# Patient Record
Sex: Male | Born: 1970 | Hispanic: Yes | Smoking: Never smoker
Health system: Southern US, Community
[De-identification: ages and names within clinical notes are randomized; demographics above are authoritative.]

## PROBLEM LIST (undated history)

## (undated) DIAGNOSIS — I10 Essential (primary) hypertension: Secondary | ICD-10-CM

## (undated) HISTORY — DX: Essential (primary) hypertension: I10

---

## 2011-03-23 HISTORY — PX: OTHER SURGICAL HISTORY: SHX169

## 2018-02-01 ENCOUNTER — Encounter: Payer: Self-pay | Admitting: Internal Medicine

## 2018-02-01 ENCOUNTER — Ambulatory Visit: Payer: BC Managed Care – PPO | Admitting: Internal Medicine

## 2018-02-01 VITALS — BP 138/76 | HR 75 | Ht 70.0 in | Wt 214.0 lb

## 2018-02-01 DIAGNOSIS — N63 Unspecified lump in unspecified breast: Secondary | ICD-10-CM | POA: Diagnosis not present

## 2018-02-01 NOTE — Progress Notes (Signed)
    Date:  02/01/2018   Name:  George GinsbergFrancisco Alvarado   DOB:  15-Feb-1971   MRN:  [id number]   Chief Complaint: Establish Care (New Patient. ) and Breast Mass (L Breast mass since 2016. )  Breast mass - on left noted in 2015 and repeat in 2016 showed no change in a fatty mass.  [facility]  He was reassured that it was only fatty and did not need to be removed.  He says that he has discomfort from time to time.  No change in size of mass.  He has intermittent discomfort on the right side as well.  No nipple discharge, skin changes.   Review of Systems  Constitutional: Negative for chills, fatigue and fever.  Respiratory: Negative for cough, chest tightness and wheezing.   Cardiovascular: Negative for chest pain, palpitations and leg swelling.  Gastrointestinal: Negative for abdominal pain.  Neurological: Negative for dizziness and headaches.  Hematological: Negative for adenopathy. Does not bruise/bleed easily.  Psychiatric/Behavioral: Negative for dysphoric mood and sleep disturbance.    Patient Active Problem List   Diagnosis Date Noted  . Breast mass in male 02/01/2018    No Known Allergies  Past Surgical History:  Procedure Laterality Date  . herniated disc repair  2013    Social History   Tobacco Use  . Smoking status: Never Smoker  . Smokeless tobacco: Never Used  Substance Use Topics  . Alcohol use: Never    Frequency: Never    Comment: rare  . Drug use: Never     Medication list has been reviewed and updated.  No outpatient medications have been marked as taking for the 02/01/18 encounter (Office Visit) with Reubin MilanBerglund, Charnelle Bergeman H, MD.    [facility]2018  PHQ - 2 Score 1    Physical Exam  Constitutional: He is oriented to person, place, and time. He appears well-developed. No distress.  HENT:  Head: Normocephalic and atraumatic.  Neck: Normal range of motion. Neck supple. Carotid bruit is not present.  Cardiovascular: Normal rate, regular  rhythm and normal heart sounds.  Pulmonary/Chest: Effort normal and breath sounds normal. No respiratory distress. He has no wheezes. He has no rhonchi. Right breast exhibits no mass, no nipple discharge and no skin change. Left breast exhibits no mass, no nipple discharge and no skin change.    Abdominal: Soft. Normal appearance.  Musculoskeletal: Normal range of motion.  Neurological: He is alert and oriented to person, place, and time.  Skin: Skin is warm and dry. No rash noted.  Psychiatric: He has a normal mood and affect. His behavior is normal. Thought content normal.  Nursing note and vitals reviewed.   BP 138/76 (BP Location: Right Arm, Patient Position: Sitting, Cuff Size: Large)   Pulse 75   Ht 5\' 10"  (1.778 m)   Wt 214 lb (97.1 kg)   SpO2 100%   BMI 30.71 kg/m   Assessment and Plan: 1. Breast mass in male Will get follow up mammogram and US Refer to General surgery - MM DIAG BREAST TOMO BILATERAL; Future - US BREAST LTD UNI LEFT INC AXILLA; Future - US BREAST LTD UNI RIGHT INC AXILLA; Future   Partially dictated using Animal nutritionistDragon software. Any errors are unintentional.  Bari EdwardLaura Abra Lingenfelter, MD [facility]  02/01/2018

## 2018-02-10 ENCOUNTER — Ambulatory Visit
Admission: RE | Admit: 2018-02-10 | Discharge: 2018-02-10 | Disposition: A | Discharge status: Home / Self Care | Payer: BC Managed Care – PPO | Source: Ambulatory Visit | Attending: Internal Medicine | Admitting: Internal Medicine

## 2018-02-10 DIAGNOSIS — N63 Unspecified lump in unspecified breast: Secondary | ICD-10-CM

## 2018-02-10 NOTE — Progress Notes (Signed)
Spoke with patient and informed of mammo.

## 2018-12-29 ENCOUNTER — Encounter: Payer: Self-pay | Admitting: Internal Medicine

## 2018-12-29 ENCOUNTER — Ambulatory Visit: Payer: BC Managed Care – PPO | Admitting: Internal Medicine

## 2018-12-29 ENCOUNTER — Other Ambulatory Visit: Payer: Self-pay

## 2018-12-29 VITALS — BP 136/84 | HR 68 | Resp 16 | Ht 70.0 in | Wt 200.0 lb

## 2018-12-29 DIAGNOSIS — R9431 Abnormal electrocardiogram [ECG] [EKG]: Secondary | ICD-10-CM

## 2018-12-29 DIAGNOSIS — M7712 Lateral epicondylitis, left elbow: Secondary | ICD-10-CM

## 2018-12-29 MED ORDER — MELOXICAM 7.5 MG PO TABS: 7.5000 mg | ORAL_TABLET | Freq: Every day | ORAL | 0 refills | Status: DC

## 2018-12-29 NOTE — Patient Instructions (Addendum)
Your blood pressure goal is 130/80 or less.  Check at home several times a week and write it down.  Tennis Elbow Tennis elbow is swelling (inflammation) in your outer forearm, near your elbow. Swelling affects the tissues that connect muscle to bone (tendons). Tennis elbow can happen in any sport or job in which you use your elbow too much. It is caused by doing the same motion over and over. Tennis elbow can cause:  Pain and tenderness in your forearm and the outer part of your elbow. You may have pain all the time, or only when using the arm.  A burning feeling. This runs from your elbow through your arm.  Weak grip in your hand. Follow these instructions at home: Activity  Rest your elbow and wrist. Avoid activities that cause problems, as told by your doctor.  If told by your doctor, wear an elbow strap to reduce stress on the area.  Do physical therapy exercises as told.  If you lift an object, lift it with your palm facing up. This is easier on your elbow. Lifestyle  If your tennis elbow is caused by sports, check your equipment and make sure that: ? You are using it correctly. ? It fits you well.  If your tennis elbow is caused by work or by using a computer, take breaks often to stretch your arm. Talk with your manager about how you can manage your condition at work. If you have a brace:  Wear the brace as told by your doctor. Remove it only as told by your doctor.  Loosen the brace if your fingers tingle, get numb, or turn cold and blue.  Keep the brace clean.  If the brace is not waterproof, ask your doctor if you may take the brace off for bathing. If you must keep the brace on while bathing: ? Do not let it get wet. ? Cover it with a watertight covering when you take a bath or a shower. General instructions   If told, put ice on the painful area: ? Put ice in a plastic bag. ? Place a towel between your skin and the bag. ? Leave the ice on for 20 minutes, 2-3  times a day.  Take over-the-counter and prescription medicines only as told by your doctor.  Keep all follow-up visits as told by your doctor. This is important. Contact a doctor if:  Your pain does not get better with treatment.  Your pain gets worse.  You have weakness in your forearm, hand, or fingers.  You cannot feel your forearm, hand, or fingers. Summary  Tennis elbow is swelling (inflammation) in your outer forearm, near your elbow.  Tennis elbow is caused by doing the same motion over and over.  Rest your elbow and wrist. Avoid activities that cause problems, as told by your doctor.  If told, put ice on the painful area for 20 minutes, 2-3 times a day. This information is not intended to replace advice given to you by your health care provider. Make sure you discuss any questions you have with your health care provider. Document Released: 08/26/2009 Document Revised: 12/02/2017 Document Reviewed: 12/21/2016 Elsevier Patient Education  2020 [place] DASH stands for "Dietary Approaches to Stop Hypertension." The DASH eating plan is a healthy eating plan that has been shown to reduce high blood pressure (hypertension). It may also reduce your risk for type 2 diabetes, heart disease, and stroke. The DASH eating plan may also help with  weight loss. What are tips for following this plan?  General guidelines  Avoid eating more than 2,300 mg (milligrams) of salt (sodium) a day. If you have hypertension, you may need to reduce your sodium intake to 1,500 mg a day.  Limit alcohol intake to no more than 1 drink a day for nonpregnant women and 2 drinks a day for men. One drink equals 12 oz of beer, 5 oz of wine, or 1 oz of hard liquor.  Work with your health care provider to maintain a healthy body weight or to lose weight. Ask what an ideal weight is for you.  Get at least 30 minutes of exercise that causes your heart to beat faster (aerobic exercise) most  days of the week. Activities may include walking, swimming, or biking.  Work with your health care provider or diet and nutrition specialist (dietitian) to adjust your eating plan to your individual calorie needs. Reading food labels   Check food labels for the amount of sodium per serving. Choose foods with less than 5 percent of the Daily Value of sodium. Generally, foods with less than 300 mg of sodium per serving fit into this eating plan.  To find whole grains, look for the word "whole" as the first word in the ingredient list. Shopping  Buy products labeled as "low-sodium" or "no salt added."  Buy fresh foods. Avoid canned foods and premade or frozen meals. Cooking  Avoid adding salt when cooking. Use salt-free seasonings or herbs instead of table salt or sea salt. Check with your health care provider or pharmacist before using salt substitutes.  Do not fry foods. Cook foods using healthy methods such as baking, boiling, grilling, and broiling instead.  Cook with heart-healthy oils, such as olive, canola, soybean, or sunflower oil. Meal planning  Eat a balanced diet that includes: ? 5 or more servings of fruits and vegetables each day. At each meal, try to fill half of your plate with fruits and vegetables. ? Up to 6-8 servings of whole grains each day. ? Less than 6 oz of lean meat, poultry, or fish each day. A 3-oz serving of meat is about the same size as a deck of cards. One egg equals 1 oz. ? 2 servings of low-fat dairy each day. ? A serving of nuts, seeds, or beans 5 times each week. ? Heart-healthy fats. Healthy fats called Omega-3 fatty acids are found in foods such as flaxseeds and coldwater fish, like sardines, salmon, and mackerel.  Limit how much you eat of the following: ? Canned or prepackaged foods. ? Food that is high in trans fat, such as fried foods. ? Food that is high in saturated fat, such as fatty meat. ? Sweets, desserts, sugary drinks, and other foods  with added sugar. ? Full-fat dairy products.  Do not salt foods before eating.  Try to eat at least 2 vegetarian meals each week.  Eat more home-cooked food and less restaurant, buffet, and fast food.  When eating at a restaurant, ask that your food be prepared with less salt or no salt, if possible. What foods are recommended? The items listed may not be a complete list. Talk with your dietitian about what dietary choices are best for you. Grains Whole-grain or whole-wheat bread. Whole-grain or whole-wheat pasta. Brown rice. Or[place] Cobbatmeal. Quinoa. Bulgur. Whole-grain and low-sodium cereals. Pita bread. Low-fat, low-sodium crackers. Whole-wheat flour tortillas. Vegetables Fresh or frozen vegetables (raw, steamed, roasted, or grilled). Low-sodium or reduced-sodium tomato and vegetable juice. Low-sodium  or reduced-sodium tomato sauce and tomato paste. Low-sodium or reduced-sodium canned vegetables. Fruits All fresh, dried, or frozen fruit. Canned fruit in natural juice (without added sugar). Meat and other protein foods Skinless chicken or [place]. Ground chicken or [place]. Pork with fat trimmed off. Fish and seafood. Egg whites. Dried beans, peas, or lentils. Unsalted nuts, nut butters, and seeds. Unsalted canned beans. Lean cuts of beef with fat trimmed off. Low-sodium, lean deli meat. Dairy Low-fat (1%) or fat-free (skim) milk. Fat-free, low-fat, or reduced-fat cheeses. Nonfat, low-sodium ricotta or cottage cheese. Low-fat or nonfat yogurt. Low-fat, low-sodium cheese. Fats and oils Soft margarine without trans fats. Vegetable oil. Low-fat, reduced-fat, or light mayonnaise and salad dressings (reduced-sodium). Canola, safflower, olive, soybean, and sunflower oils. Avocado. Seasoning and other foods Herbs. Spices. Seasoning mixes without salt. Unsalted popcorn and pretzels. Fat-free sweets. What foods are not recommended? The items listed may not be a complete list. Talk with your dietitian about  what dietary choices are best for you. Grains Baked goods made with fat, such as croissants, muffins, or some breads. Dry pasta or rice meal packs. Vegetables Creamed or fried vegetables. Vegetables in a cheese sauce. Regular canned vegetables (not low-sodium or reduced-sodium). Regular canned tomato sauce and paste (not low-sodium or reduced-sodium). Regular tomato and vegetable juice (not low-sodium or reduced-sodium). Rosita Fire. Olives. Fruits Canned fruit in a light or heavy syrup. Fried fruit. Fruit in cream or butter sauce. Meat and other protein foods Fatty cuts of meat. Ribs. Fried meat. Tomasa Blase. Sausage. Bologna and other processed lunch meats. Salami. Fatback. Hotdogs. Bratwurst. Salted nuts and seeds. Canned beans with added salt. Canned or smoked fish. Whole eggs or egg yolks. Chicken or [place] with skin. Dairy Whole or 2% milk, cream, and half-and-half. Whole or full-fat cream cheese. Whole-fat or sweetened yogurt. Full-fat cheese. Nondairy creamers. Whipped toppings. Processed cheese and cheese spreads. Fats and oils Butter. Stick margarine. Lard. Shortening. Ghee. Bacon fat. Tropical oils, such as coconut, palm kernel, or palm oil. Seasoning and other foods Salted popcorn and pretzels. Onion salt, garlic salt, seasoned salt, table salt, and sea salt. Worcestershire sauce. Tartar sauce. Barbecue sauce. Teriyaki sauce. Soy sauce, including reduced-sodium. Steak sauce. Canned and packaged gravies. Fish sauce. Oyster sauce. Cocktail sauce. Horseradish that you find on the shelf. Ketchup. Mustard. Meat flavorings and tenderizers. Bouillon cubes. Hot sauce and Tabasco sauce. Premade or packaged marinades. Premade or packaged taco seasonings. Relishes. Regular salad dressings. Where to find more information:  National Heart, Lung, and Blood Institute: PopSteam.is  American Heart Association: www.heart.org Summary  The DASH eating plan is a healthy eating plan that has been shown to  reduce high blood pressure (hypertension). It may also reduce your risk for type 2 diabetes, heart disease, and stroke.  With the DASH eating plan, you should limit salt (sodium) intake to 2,300 mg a day. If you have hypertension, you may need to reduce your sodium intake to 1,500 mg a day.  When on the DASH eating plan, aim to eat more fresh fruits and vegetables, whole grains, lean proteins, low-fat dairy, and heart-healthy fats.  Work with your health care provider or diet and nutrition specialist (dietitian) to adjust your eating plan to your individual calorie needs. This information is not intended to replace advice given to you by your health care provider. Make sure you discuss any questions you have with your health care provider. Document Released: 02/25/2011 Document Revised: 02/18/2017 Document Reviewed: 03/01/2016 Elsevier Patient Education  2020 [employer].

## 2018-12-29 NOTE — Progress Notes (Signed)
Date:  12/29/2018   Name:  George Alvarado   DOB:  10-28-1970   MRN:  [id number]   Chief Complaint: Arm Pain (Left arm pain x 2 mo - hurts around elbow. No injury but had surgery in upper Larm installed screws to stop from shoulder displacement. )  Arm Pain  There was no injury mechanism. The pain is present in the left elbow. The quality of the pain is described as aching. The pain does not radiate. The pain is mild. The pain has been fluctuating since the incident. Pertinent negatives include no chest pain, numbness or tingling. The symptoms are aggravated by movement and lifting. He has tried nothing for the symptoms.   Borderline abnormal EKG - from work exam 05/2018 sinus brady @ 54, minimal voltage criteria for LVH.  He is concerned because his BP has been intermittently elevated.  It is often high in doctor offices at first, then comes down to normal.  He has a cuff at home and his wife is a Marine scientist but he has not been checking it regularly.  He has no chest pain, edema, SOB, dizziness or headache.  He denies a high salt diet. He exercises regularly.  Review of Systems  Constitutional: Negative for chills, fatigue, fever and unexpected weight change.  Respiratory: Negative for cough, chest tightness and shortness of breath.   Cardiovascular: Negative for chest pain, palpitations and leg swelling.  Gastrointestinal: Negative for abdominal pain.  Musculoskeletal: Positive for arthralgias. Negative for gait problem, joint swelling and myalgias.  Neurological: Negative for dizziness, tingling, weakness, numbness and headaches.    Patient Active Problem List   Diagnosis Date Noted  . Breast mass in male 02/01/2018    No Known Allergies  Past Surgical History:  Procedure Laterality Date  . herniated disc repair  2013    Social History   Tobacco Use  . Smoking status: Never Smoker  . Smokeless tobacco: Never Used  Substance Use Topics  . Alcohol use: Never    Frequency:  Never    Comment: rare  . Drug use: Never     Medication list has been reviewed and updated.  No outpatient medications have been marked as taking for the 12/29/18 encounter (Office Visit) with Glean Hess, MD.    [facility] 2/9 Scores 12/29/2018 02/01/2018  PHQ - 2 Score 0 1    BP Readings from Last 3 Encounters:  12/29/18 136/84  02/01/18 138/76    Physical Exam Vitals signs and nursing note reviewed.  Constitutional:      General: He is not in acute distress.    Appearance: He is well-developed.  HENT:     Head: Normocephalic and atraumatic.  Neck:     Musculoskeletal: Normal range of motion.     Vascular: No carotid bruit.  Cardiovascular:     Rate and Rhythm: Normal rate and regular rhythm.     Pulses: Normal pulses.  Pulmonary:     Effort: Pulmonary effort is normal. No respiratory distress.  Musculoskeletal: Normal range of motion.        General: Tenderness present.     Left elbow: Tenderness found. Lateral epicondyle tenderness noted.     Right lower leg: No edema.     Left lower leg: No edema.  Lymphadenopathy:     Cervical: No cervical adenopathy.  Skin:    General: Skin is warm and dry.     Findings: No rash.  Neurological:     Mental Status: He is  alert and oriented to person, place, and time.  Psychiatric:        Behavior: Behavior normal.        Thought Content: Thought content normal.     Wt Readings from Last 3 Encounters:  12/29/18 200 lb (90.7 kg)  02/01/18 214 lb (97.1 kg)    BP 136/84   Pulse 68   Resp 16   Ht 5\' 10"  (1.778 m)   Wt 200 lb (90.7 kg)   SpO2 100%   BMI 28.70 kg/m   Assessment and Plan: 1. Lateral epicondylitis of left elbow Handout given - recommend ice and tennis elbow strap Follow up if not improving - meloxicam (MOBIC) 7.5 MG tablet; Take 1 tablet (7.5 mg total) by mouth daily. For tennis elbow  Dispense: 30 tablet; Refill: 0  2. Abnormal finding on EKG With borderline elevation in BP at time Recommend DASH  diet, monitor BP at home and return if persistently elevated   Partially dictated using . Any errors are unintentional.  Animal nutritionist, MD [facility]  12/29/2018

## 2019-01-26 ENCOUNTER — Other Ambulatory Visit: Payer: Self-pay | Admitting: Internal Medicine

## 2019-01-26 DIAGNOSIS — M7712 Lateral epicondylitis, left elbow: Secondary | ICD-10-CM

## 2019-03-01 ENCOUNTER — Other Ambulatory Visit: Payer: Self-pay | Admitting: Internal Medicine

## 2019-03-01 ENCOUNTER — Other Ambulatory Visit: Payer: Self-pay

## 2019-03-01 DIAGNOSIS — M7712 Lateral epicondylitis, left elbow: Secondary | ICD-10-CM

## 2019-07-16 ENCOUNTER — Ambulatory Visit: Payer: BC Managed Care – PPO | Admitting: Internal Medicine

## 2019-07-16 ENCOUNTER — Other Ambulatory Visit: Payer: Self-pay

## 2019-07-16 ENCOUNTER — Encounter: Payer: Self-pay | Admitting: Internal Medicine

## 2019-07-16 VITALS — BP 140/84 | HR 87 | Temp 98.3°F | Ht 70.0 in | Wt 201.0 lb

## 2019-07-16 DIAGNOSIS — M5441 Lumbago with sciatica, right side: Secondary | ICD-10-CM

## 2019-07-16 MED ORDER — IBUPROFEN 600 MG PO TABS: 600.0000 mg | ORAL_TABLET | Freq: Three times a day (TID) | ORAL | 0 refills | Status: AC | PRN

## 2019-07-16 MED ORDER — TRAMADOL HCL 50 MG PO TABS: 50.0000 mg | ORAL_TABLET | Freq: Three times a day (TID) | ORAL | 0 refills | Status: AC | PRN

## 2019-07-16 NOTE — Progress Notes (Signed)
Date:  07/16/2019   Name:  George Alvarado   DOB:  09/22/1970   MRN:  [id number]   Chief Complaint: Back Pain (Back pain. Had back surgery in the past. Gets flared up on and off. Has used tramadol for the pain in the past. Using advil but does not have prescription anti-inflammatory,)  Back Pain This is a recurrent problem. The problem occurs intermittently. The problem has been waxing and waning since onset. The pain is present in the lumbar spine. The pain does not radiate. The pain is moderate. Pertinent negatives include no bladder incontinence, bowel incontinence, chest pain, fever, headaches, numbness or weakness. He has tried NSAIDs and analgesics for the symptoms.       Review of Systems  Constitutional: Negative for chills, fatigue and fever.  Respiratory: Negative for cough, chest tightness, shortness of breath and wheezing.   Cardiovascular: Negative for chest pain and palpitations.  Gastrointestinal: Negative for bowel incontinence.  Genitourinary: Negative for bladder incontinence and difficulty urinating.  Musculoskeletal: Positive for back pain. Negative for myalgias.  Neurological: Negative for dizziness, weakness, numbness and headaches.  Psychiatric/Behavioral: Negative for sleep disturbance.    Patient Active Problem List   Diagnosis Date Noted  . Breast mass in male 02/01/2018    No Known Allergies  Past Surgical History:  Procedure Laterality Date  . herniated disc repair  2013    Social History   Tobacco Use  . Smoking status: Never Smoker  . Smokeless tobacco: Never Used  Substance Use Topics  . Alcohol use: Never    Comment: rare  . Drug use: Never     Medication list has been reviewed and updated.  No outpatient medications have been marked as taking for the 07/16/19 encounter (Office Visit) with Glean Hess, MD.    [facility] 2/9 Scores 07/16/2019 12/29/2018 02/01/2018  PHQ - 2 Score 0 0 1  PHQ- 9 Score 0 - -    BP Readings from  Last 3 Encounters:  07/16/19 140/84  12/29/18 136/84  02/01/18 138/76    Physical Exam Vitals and nursing note reviewed.  Constitutional:      General: He is not in acute distress.    Appearance: He is well-developed.  HENT:     Head: Normocephalic and atraumatic.  Cardiovascular:     Rate and Rhythm: Normal rate and regular rhythm.     Pulses: Normal pulses.  Pulmonary:     Effort: Pulmonary effort is normal. No respiratory distress.     Breath sounds: No wheezing or rhonchi.  Musculoskeletal:     Lumbar back: No bony tenderness. Negative right straight leg raise test and negative left straight leg raise test.     Right lower leg: No edema.     Left lower leg: No edema.  Skin:    General: Skin is warm and dry.     Capillary Refill: Capillary refill takes less than 2 seconds.     Findings: No rash.  Neurological:     Mental Status: He is alert and oriented to person, place, and time.  Psychiatric:        Behavior: Behavior normal.        Thought Content: Thought content normal.     Wt Readings from Last 3 Encounters:  07/16/19 201 lb (91.2 kg)  12/29/18 200 lb (90.7 kg)  02/01/18 214 lb (97.1 kg)    BP 140/84   Pulse 87   Temp 98.3 F (36.8 C) (Temporal)   Ht  5\' 10"  (1.778 m)   Wt 201 lb (91.2 kg)   SpO2 100%   BMI 28.84 kg/m   Assessment and Plan: 1. Right-sided low back pain with sciatica, sciatica laterality unspecified, unspecified chronicity Intermittent flares of chronic low back pain S/p laminectomy 2013 - ibuprofen (ADVIL) 600 MG tablet; Take 1 tablet (600 mg total) by mouth every 8 (eight) hours as needed.  Dispense: 100 tablet; Refill: 0 - traMADol (ULTRAM) 50 MG tablet; Take 1 tablet (50 mg total) by mouth every 8 (eight) hours as needed for up to 5 days.  Dispense: 15 tablet; Refill: 0   Partially dictated using 2014. Any errors are unintentional.  Animal nutritionist, MD [facility]  07/16/2019

## 2020-04-14 ENCOUNTER — Other Ambulatory Visit: Payer: Self-pay | Admitting: Physician Assistant

## 2020-04-14 ENCOUNTER — Ambulatory Visit (INDEPENDENT_AMBULATORY_CARE_PROVIDER_SITE_OTHER): Payer: Self-pay

## 2020-04-14 ENCOUNTER — Other Ambulatory Visit: Payer: Self-pay

## 2020-04-14 DIAGNOSIS — M533 Sacrococcygeal disorders, not elsewhere classified: Secondary | ICD-10-CM

## 2020-04-14 DIAGNOSIS — M546 Pain in thoracic spine: Secondary | ICD-10-CM

## 2020-04-14 DIAGNOSIS — M545 Low back pain, unspecified: Secondary | ICD-10-CM

## 2020-04-14 DIAGNOSIS — R52 Pain, unspecified: Secondary | ICD-10-CM

## 2020-04-14 DIAGNOSIS — W010XXA Fall on same level from slipping, tripping and stumbling without subsequent striking against object, initial encounter: Secondary | ICD-10-CM

## 2021-08-26 IMAGING — DX DG LUMBAR SPINE COMPLETE 4+V
5 series · 5 of 5 positions shown · non-contrast
Comparison: None.

CLINICAL DATA: Fell on the ice.  Lumbosacral pain.

EXAM:
LUMBAR SPINE - COMPLETE 4+ VIEW

[l-spine ap]
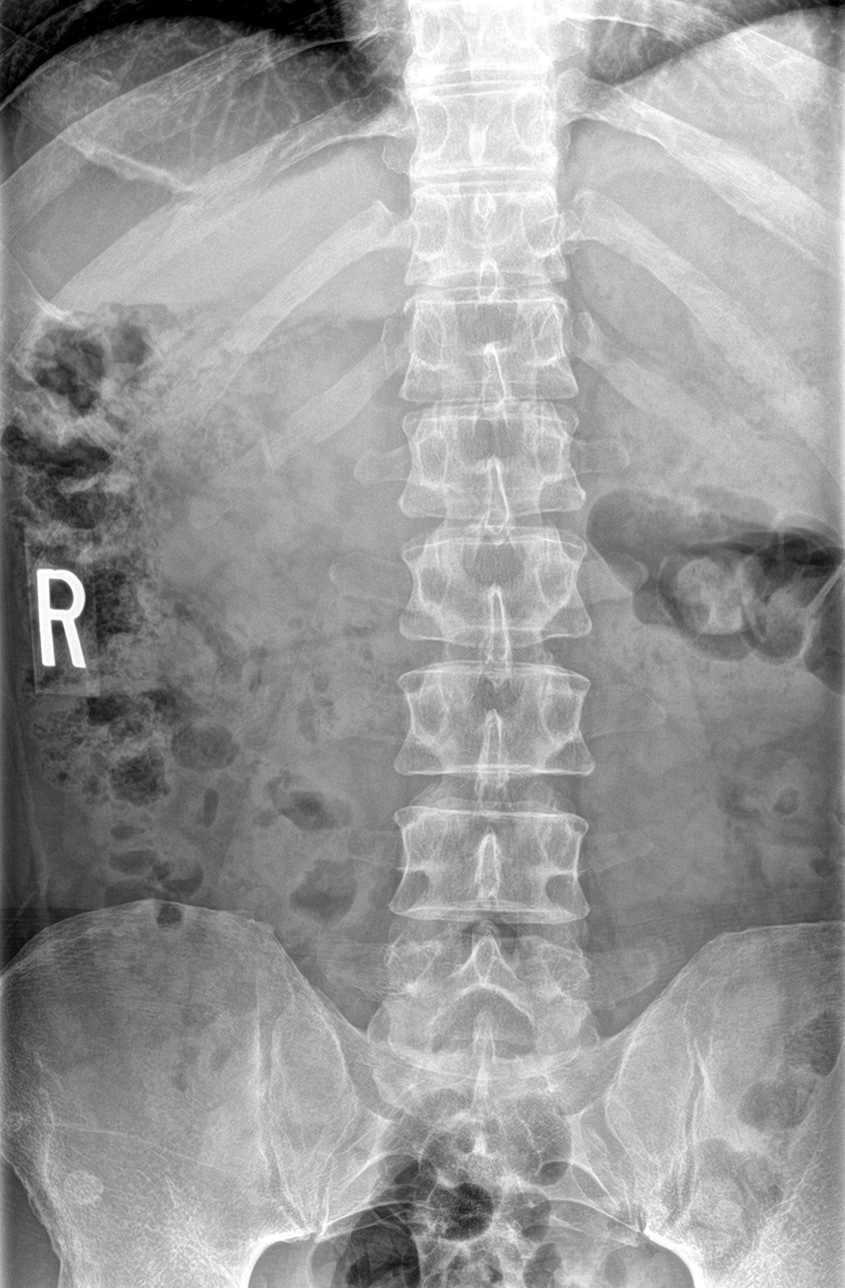

[l-spine obl (1 of 2)]
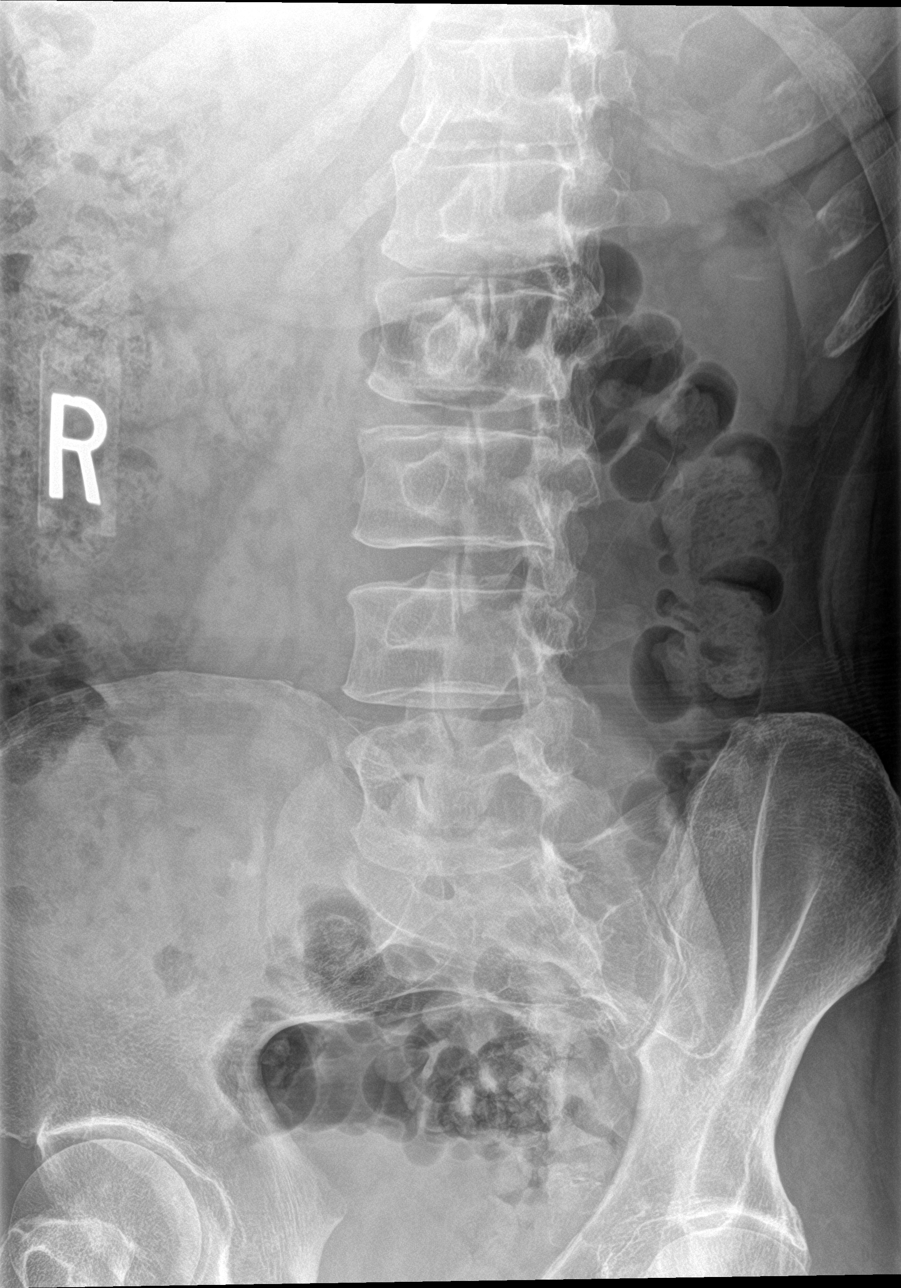

[l-spine obl (2 of 2)]
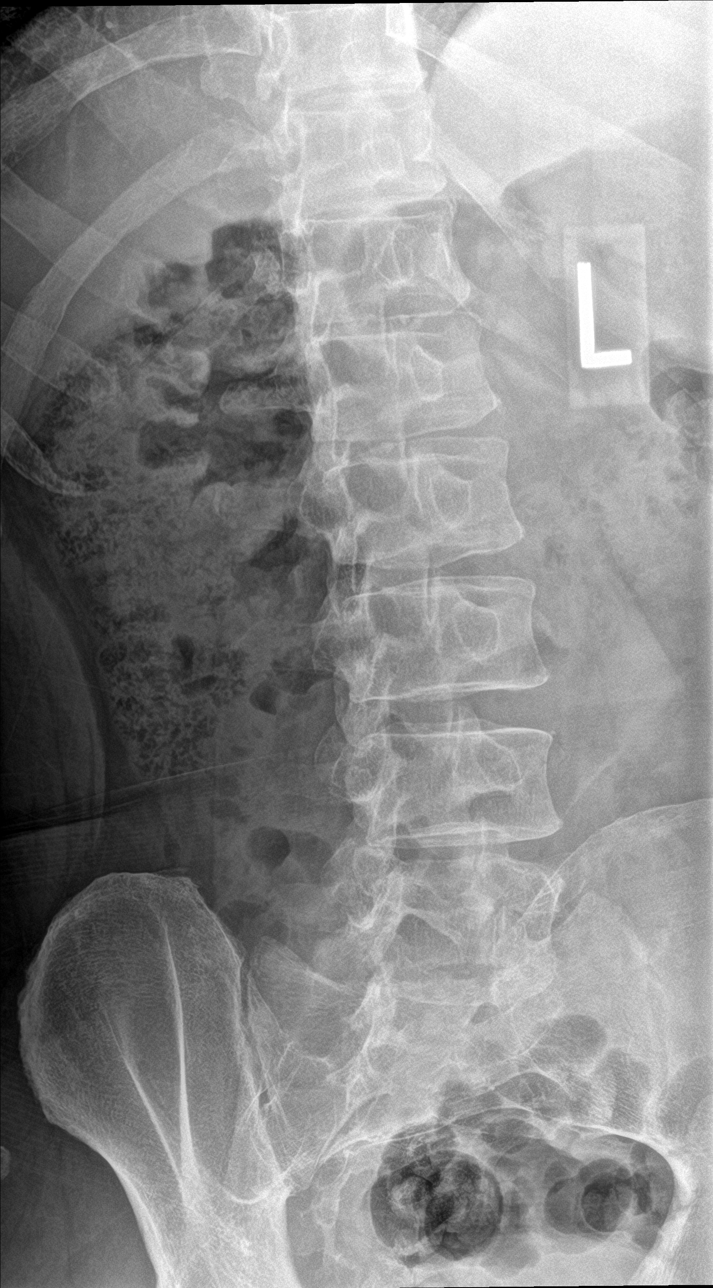

[l-spine lat (1 of 2)]
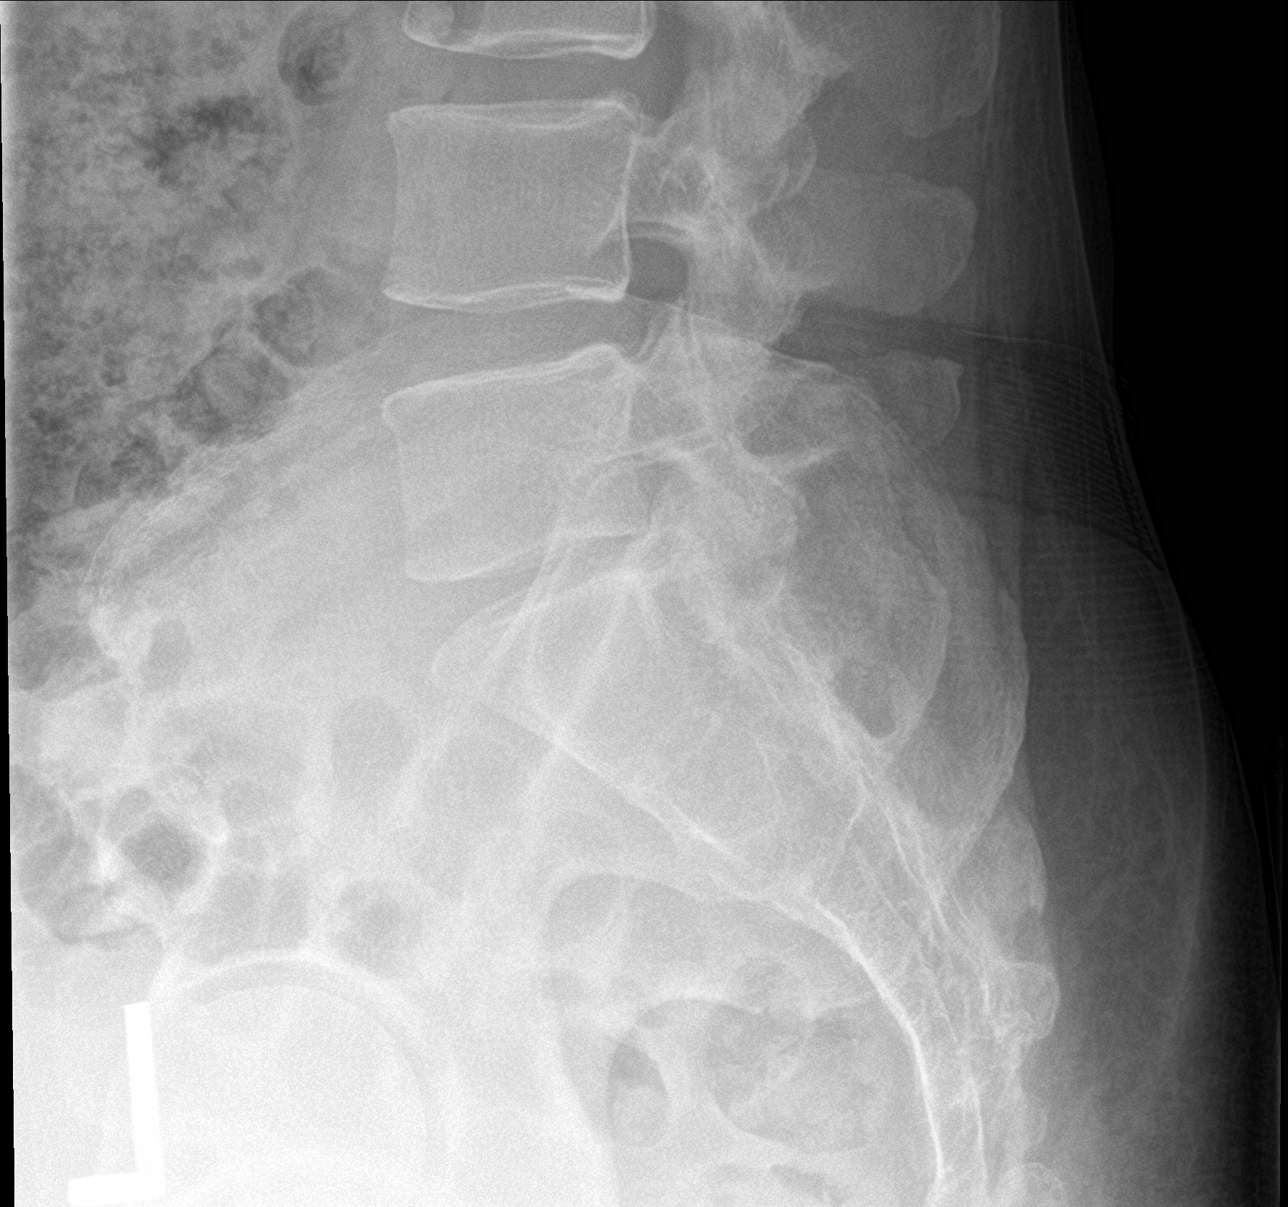

[l-spine lat (2 of 2)]
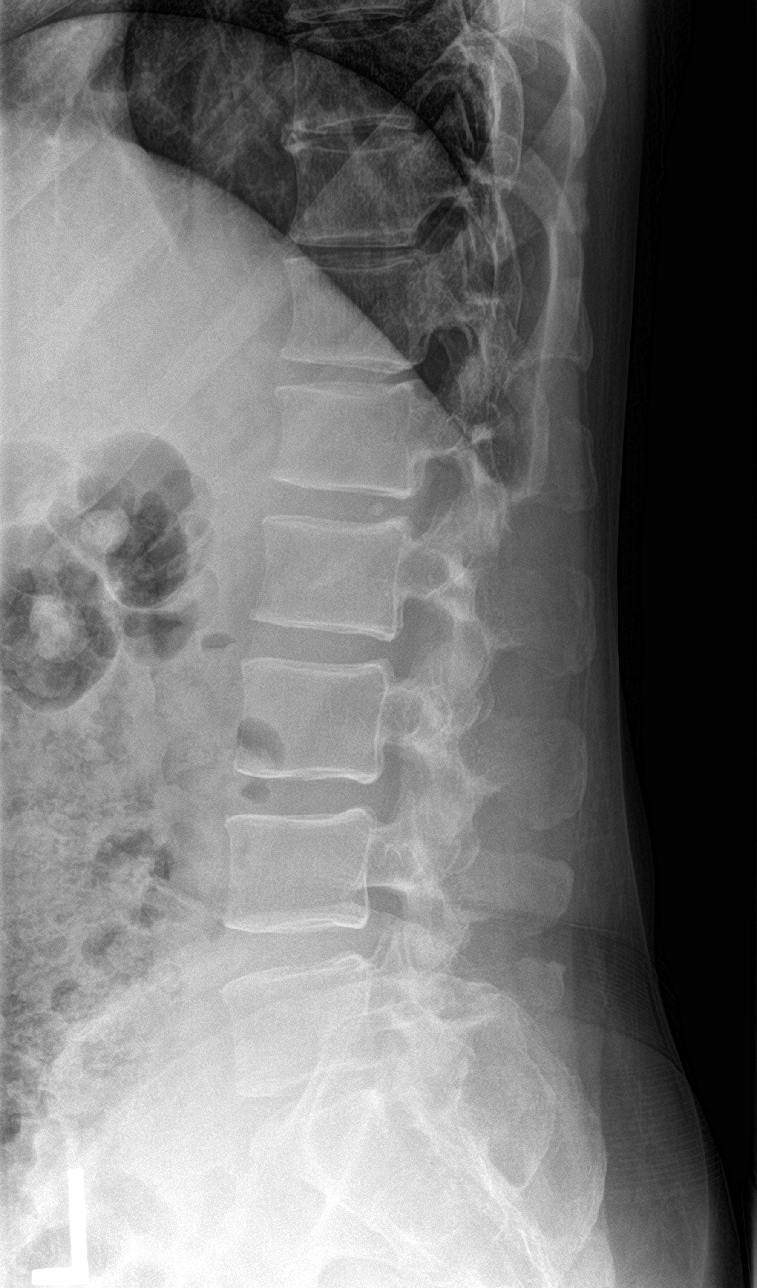

[5 of 5 positions shown; findings below may reference images not displayed]

FINDINGS: No evidence of fracture or traumatic malalignment. Mild disc space
narrowing at L5-S1. No other regional abnormality.
IMPRESSION: No acute or traumatic finding. Mild disc space narrowing L5-S1.

## 2021-08-26 IMAGING — DX DG SACRUM/COCCYX 2+V
3 series · 3 of 3 positions shown · non-contrast
Comparison: None.

CLINICAL DATA: Fell on the ice.  Sacrococcygeal pain.

EXAM:
SACRUM AND COCCYX - 2+ VIEW

[coccyx ap]
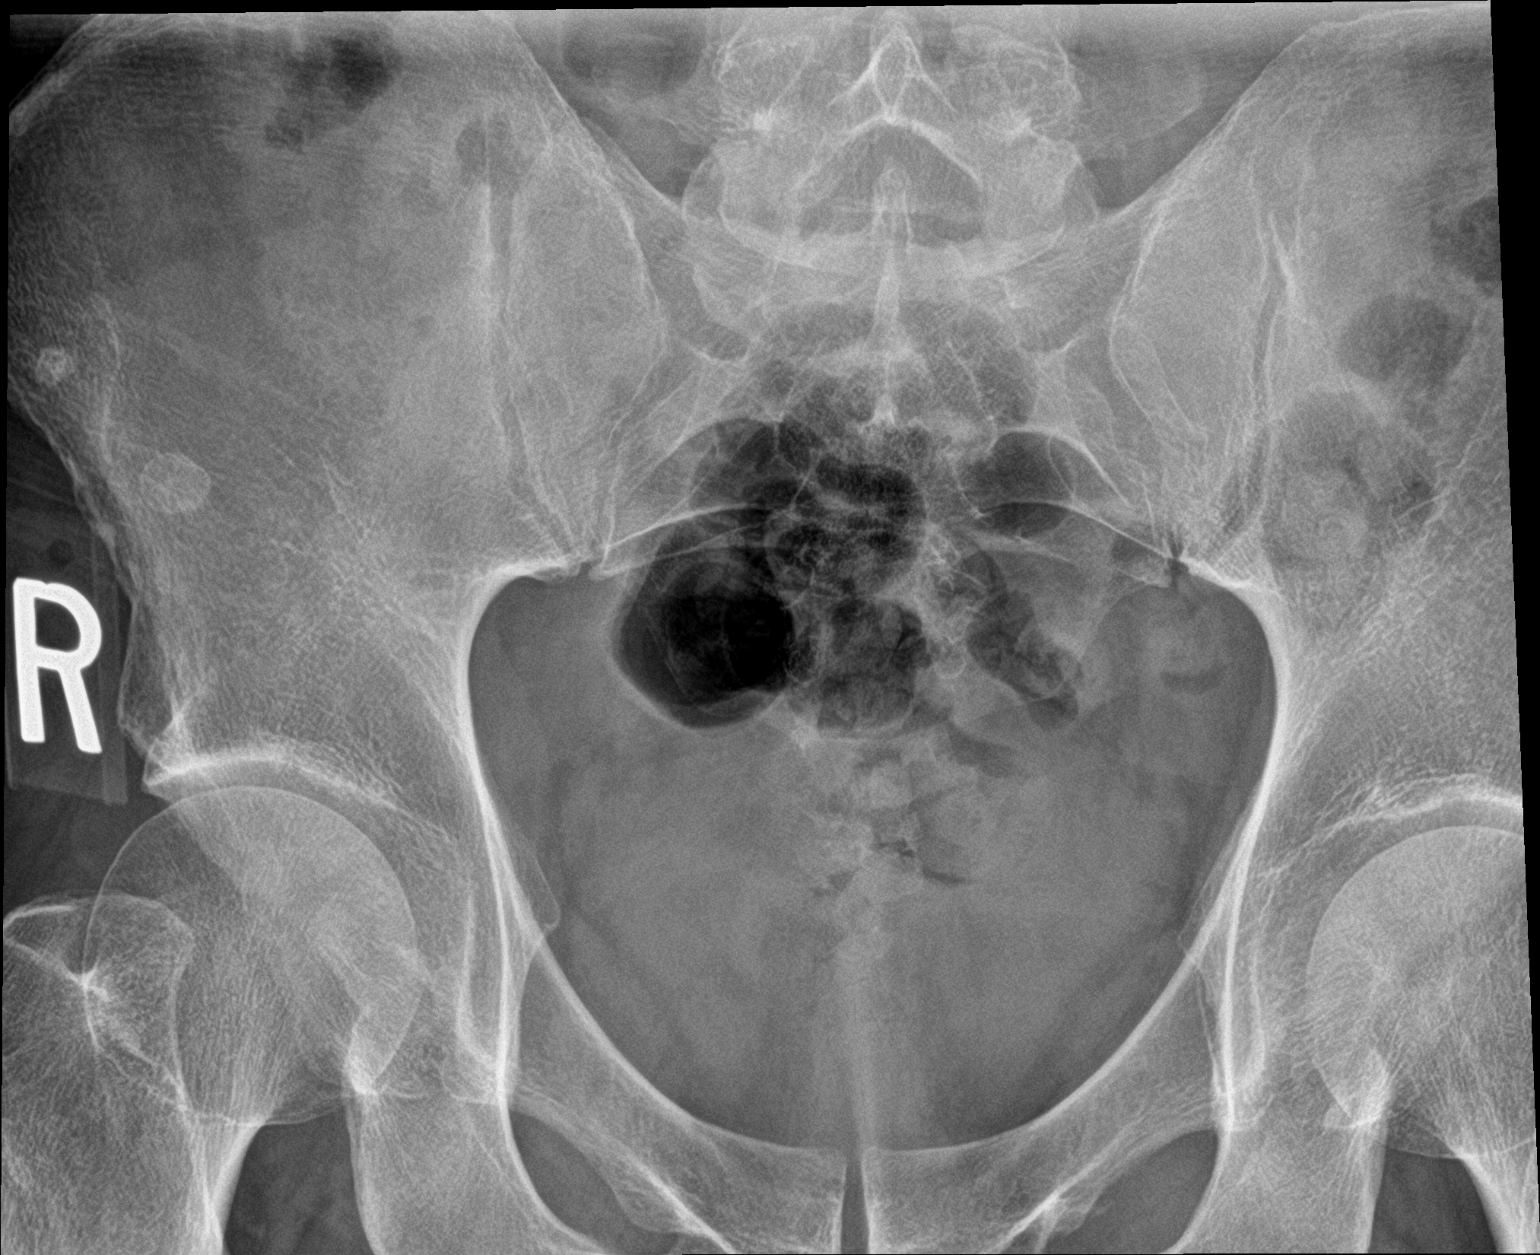

[sacrum ap]
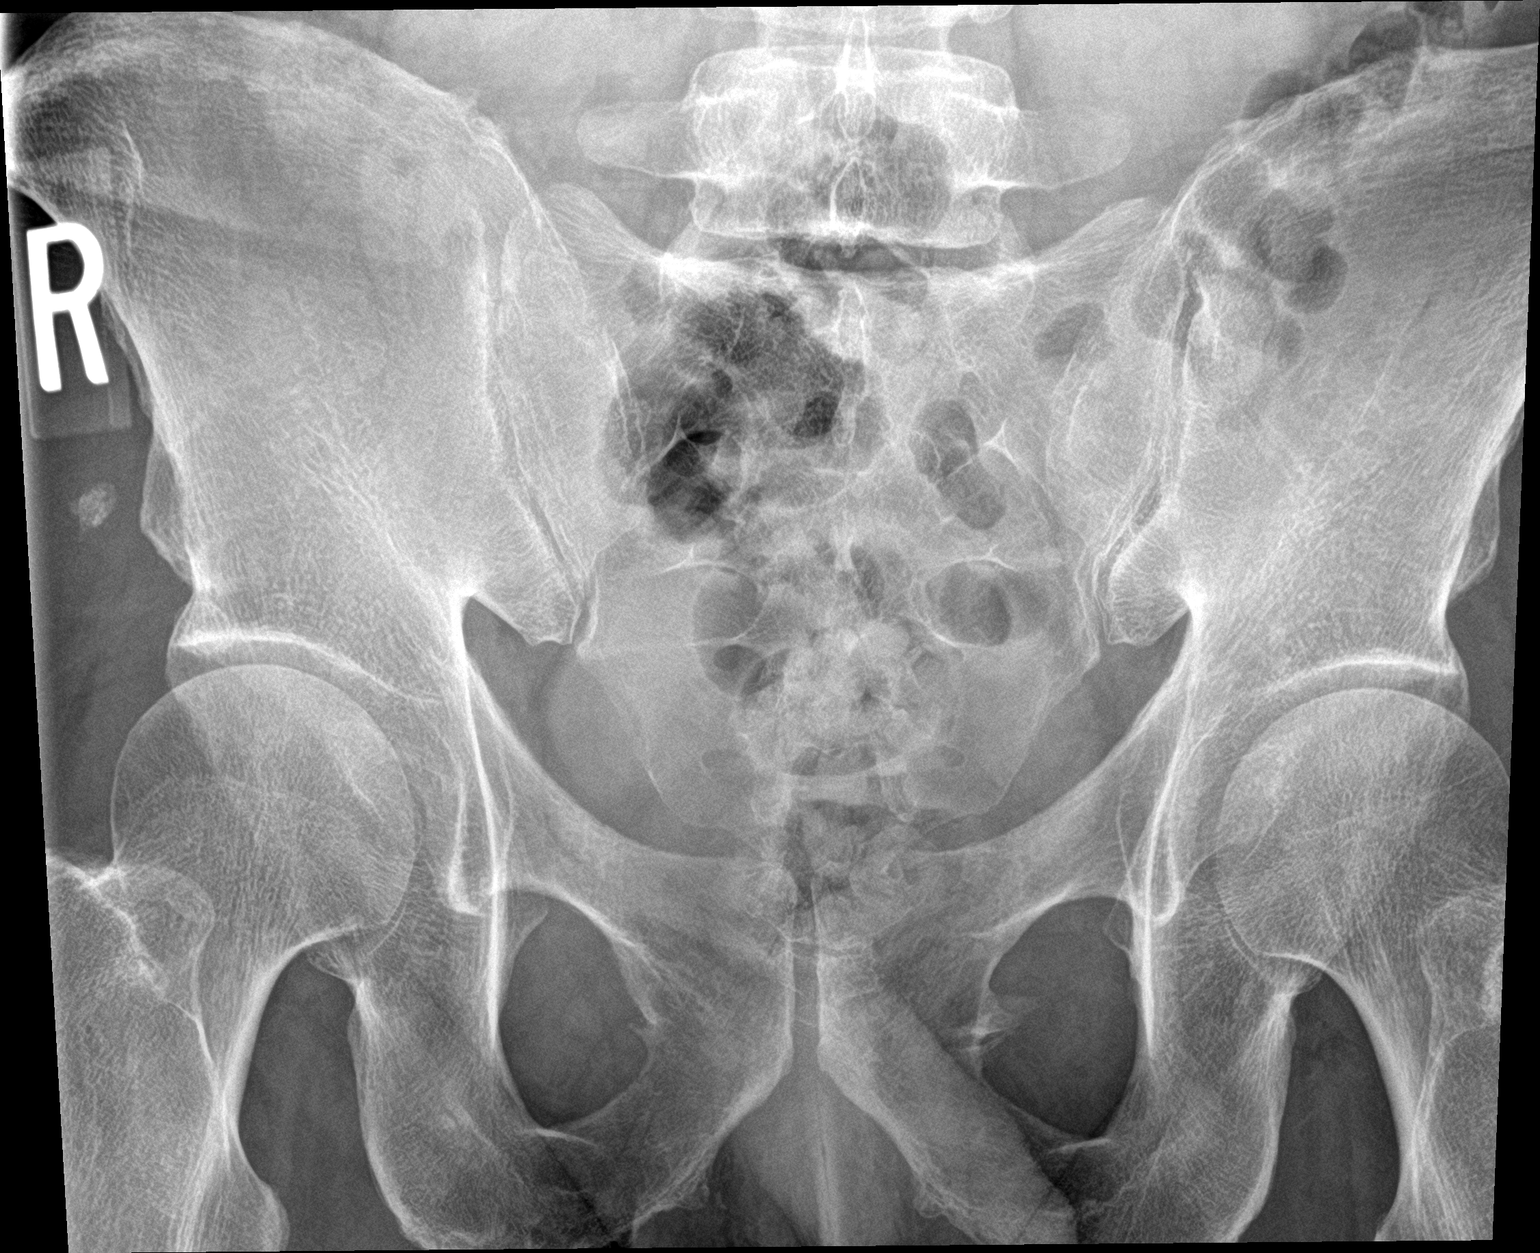

[sacrum lat]
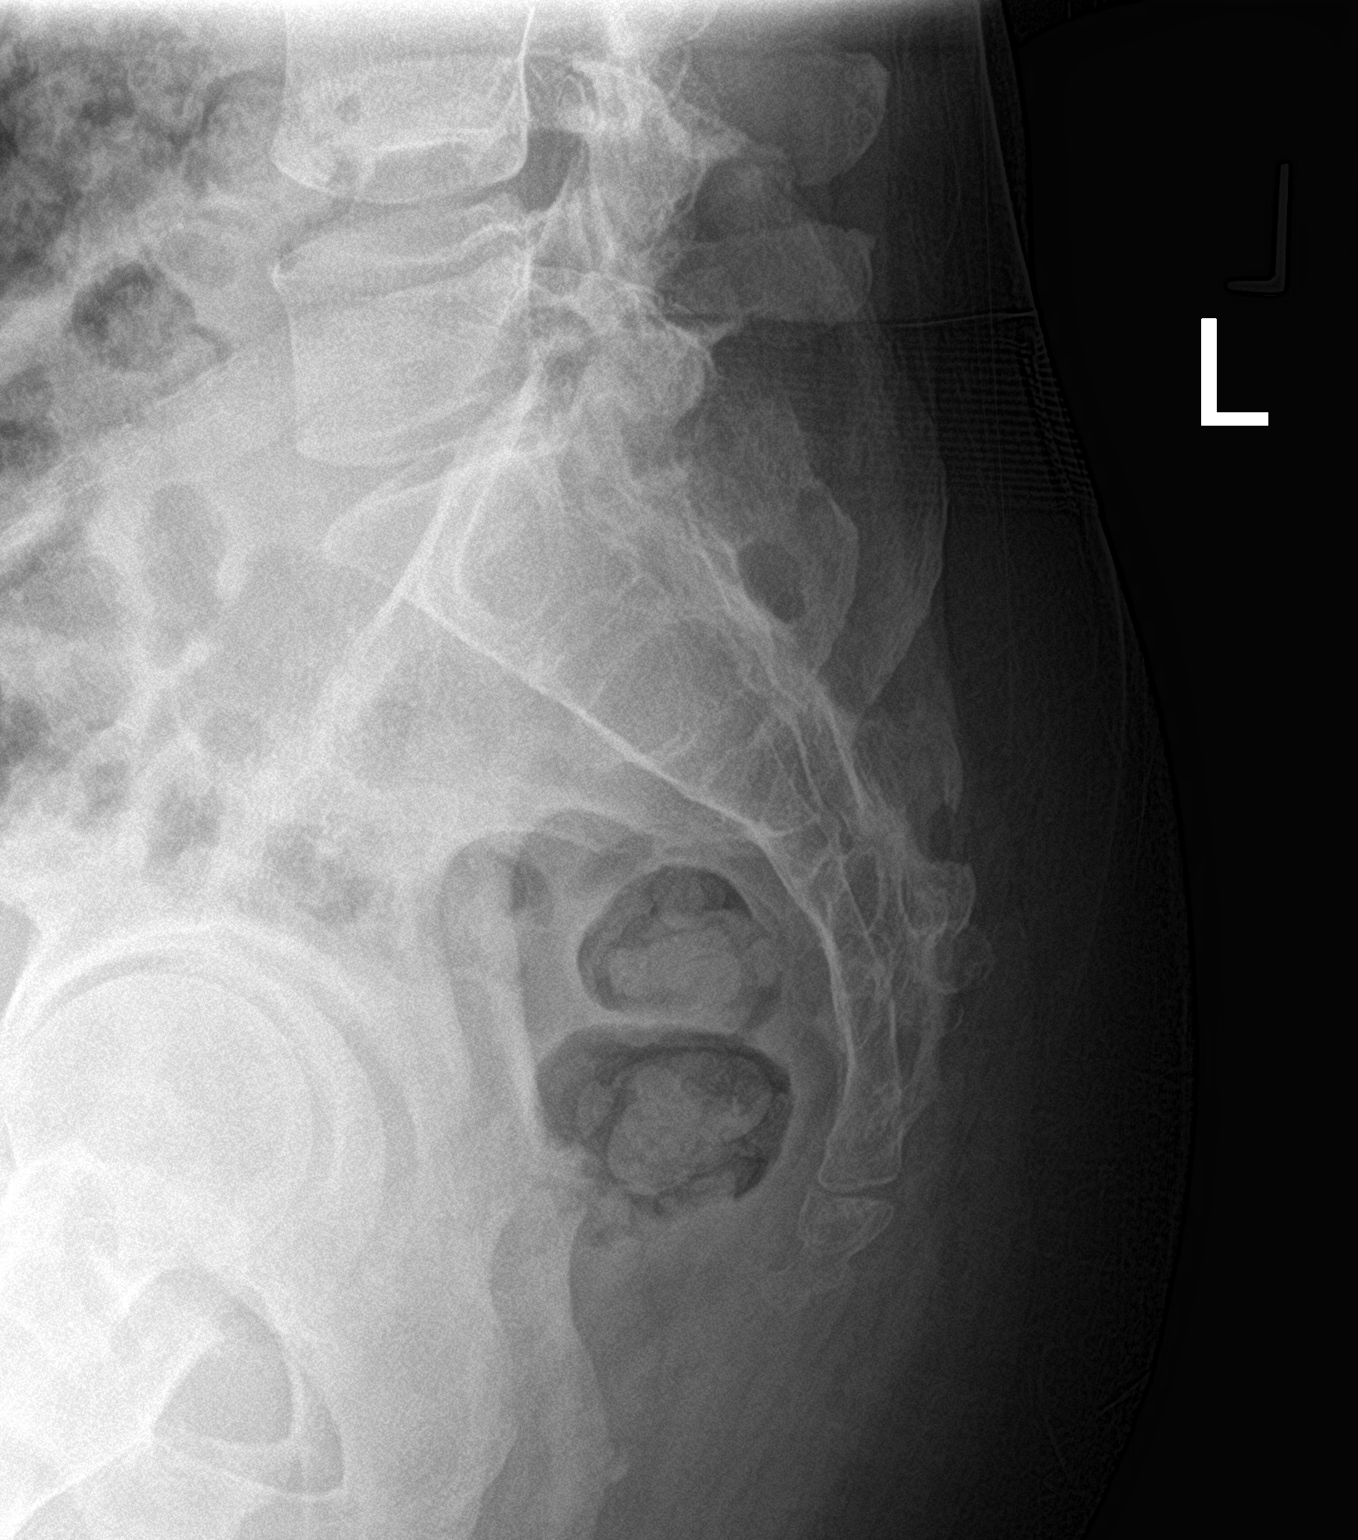

[3 of 3 positions shown; findings below may reference images not displayed]

FINDINGS: There is no evidence of fracture or other focal bone lesions.
IMPRESSION: Negative.

## 2023-08-18 ENCOUNTER — Encounter: Payer: Self-pay | Admitting: Internal Medicine

## 2023-08-18 ENCOUNTER — Ambulatory Visit: Admitting: Internal Medicine

## 2023-08-18 VITALS — BP 130/84 | HR 71 | Temp 98.5°F | Ht 70.0 in | Wt 233.5 lb

## 2023-08-18 DIAGNOSIS — R252 Cramp and spasm: Secondary | ICD-10-CM

## 2023-08-18 DIAGNOSIS — K219 Gastro-esophageal reflux disease without esophagitis: Secondary | ICD-10-CM | POA: Diagnosis not present

## 2023-08-18 DIAGNOSIS — I10 Essential (primary) hypertension: Secondary | ICD-10-CM | POA: Diagnosis not present

## 2023-08-18 DIAGNOSIS — R202 Paresthesia of skin: Secondary | ICD-10-CM

## 2023-08-18 DIAGNOSIS — E66811 Obesity, class 1: Secondary | ICD-10-CM

## 2023-08-18 LAB — COMPREHENSIVE METABOLIC PANEL WITH GFR
ALT: 26 U/L (ref 0–53)
AST: 21 U/L (ref 0–37)
Albumin: 4.6 g/dL (ref 3.5–5.2)
Alkaline Phosphatase: 77 U/L (ref 39–117)
BUN: 16 mg/dL (ref 6–23)
CO2: 32 meq/L (ref 19–32)
Calcium: 9.4 mg/dL (ref 8.4–10.5)
Chloride: 102 meq/L (ref 96–112)
Creatinine, Ser: 1.19 mg/dL (ref 0.40–1.50)
GFR: 69.83 mL/min (ref >60.00)
Glucose, Bld: 101 mg/dL — ABNORMAL HIGH (ref 70–99)
Potassium: 4.3 meq/L (ref 3.5–5.1)
Sodium: 140 meq/L (ref 135–145)
Total Bilirubin: 0.9 mg/dL (ref 0.2–1.2)
Total Protein: 7.8 g/dL (ref 6.0–8.3)

## 2023-08-18 LAB — LIPID PANEL
Cholesterol: 191 mg/dL (ref 0–200)
HDL: 49 mg/dL (ref >39.00)
LDL Cholesterol: 124 mg/dL — ABNORMAL HIGH (ref 0–99)
NonHDL: 142.15
Total CHOL/HDL Ratio: 4
Triglycerides: 89 mg/dL (ref 0.0–149.0)
VLDL: 17.8 mg/dL (ref 0.0–40.0)

## 2023-08-18 LAB — CBC WITH DIFFERENTIAL/PLATELET
Basophils Absolute: 0 10*3/uL (ref 0.0–0.1)
Basophils Relative: 0.4 % (ref 0.0–3.0)
Eosinophils Absolute: 0.1 10*3/uL (ref 0.0–0.7)
Eosinophils Relative: 1.1 % (ref 0.0–5.0)
HCT: 45.5 % (ref 39.0–52.0)
Hemoglobin: 15.2 g/dL (ref 13.0–17.0)
Lymphocytes Relative: 29.6 % (ref 12.0–46.0)
Lymphs Abs: 1.8 10*3/uL (ref 0.7–4.0)
MCHC: 33.4 g/dL (ref 30.0–36.0)
MCV: 92.4 fl (ref 78.0–100.0)
Monocytes Absolute: 0.5 10*3/uL (ref 0.1–1.0)
Monocytes Relative: 8 % (ref 3.0–12.0)
Neutro Abs: 3.8 10*3/uL (ref 1.4–7.7)
Neutrophils Relative %: 60.9 % (ref 43.0–77.0)
Platelets: 222 10*3/uL (ref 150.0–400.0)
RBC: 4.93 Mil/uL (ref 4.22–5.81)
RDW: 12.5 % (ref 11.5–15.5)
WBC: 6.2 10*3/uL (ref 4.0–10.5)

## 2023-08-18 LAB — VITAMIN D 25 HYDROXY (VIT D DEFICIENCY, FRACTURES): VITD: 20.57 ng/mL — ABNORMAL LOW (ref 30.00–100.00)

## 2023-08-18 LAB — TSH: TSH: 0.72 u[IU]/mL (ref 0.35–5.50)

## 2023-08-18 LAB — HEMOGLOBIN A1C: Hgb A1c MFr Bld: 5.5 % (ref 4.6–6.5)

## 2023-08-18 LAB — VITAMIN B12: Vitamin B-12: 355 pg/mL (ref 211–911)

## 2023-08-18 MED ORDER — PANTOPRAZOLE SODIUM 40 MG PO TBEC: 40.0000 mg | DELAYED_RELEASE_TABLET | Freq: Every day | ORAL | 1 refills | Status: AC

## 2023-08-18 MED ORDER — PANTOPRAZOLE SODIUM 40 MG PO TBEC
40.0000 mg | DELAYED_RELEASE_TABLET | Freq: Every day | ORAL | 1 refills | Status: DC
Start: 2023-08-18 — End: 2023-08-18

## 2023-08-18 NOTE — Progress Notes (Signed)
 New Patient Office Visit     CC/Reason for Visit: Establish care, discuss chronic and acute concerns Previous PCP: Janna Melter, MD Last Visit: Unknown  HPI: George Alvarado is a 53 y.o. male who is coming in today for the above mentioned reasons. Past Medical History is significant for: Hypertension not on medication.  He does not smoke, drinks only occasionally, no allergies his family history significant for mother with hypertension.  He has never had colon cancer screening and is not interested in considering today.  He is due for Tdap, shingles, pneumonia vaccinations but would like to defer to a future visit.  He has been experiencing epigastric and left upper quadrant abdominal pain after eating heavy meals.  He has been taking what sounds like over-the-counter acid suppression pills with relief.  He has also been having some tingling, numbness in cramps in his hands, feet, calves and sometimes even in his abdominal wall muscles.   Past Medical/Surgical History: Past Medical History:  Diagnosis Date   Hypertension     Past Surgical History:  Procedure Laterality Date   herniated disc repair  2013    Social History:  reports that he has never smoked. He has never used smokeless tobacco. He reports current alcohol use of about 1.0 standard drink of alcohol per week. He reports that he does not use drugs.  Allergies: No Known Allergies  Family History:  Family History  Problem Relation Age of Onset   Hypertension Mother    Cancer Neg Hx    Diabetes Neg Hx    Heart disease Neg Hx    Kidney disease Neg Hx    Alcohol abuse Neg Hx    Depression Neg Hx      Current Outpatient Medications:    ibuprofen  (ADVIL ) 600 MG tablet, Take 1 tablet (600 mg total) by mouth every 8 (eight) hours as needed., Disp: 100 tablet, Rfl: 0   pantoprazole  (PROTONIX ) 40 MG tablet, Take 1 tablet (40 mg total) by mouth daily., Disp: 90 tablet, Rfl: 1  Review of Systems:  Negative  except as indicated in HPI.   Physical Exam: Vitals:   08/18/23 1337  BP: 130/84  Pulse: 71  Temp: 98.5 F (36.9 C)  TempSrc: Oral  SpO2: 99%  Weight: 233 lb 8 oz (105.9 kg)  Height: 5\' 10"  (1.778 m)   Body mass index is 33.5 kg/m.  Physical Exam    Impression and Plan:  Gastroesophageal reflux disease, unspecified whether esophagitis present -     Pantoprazole  Sodium; Take 1 tablet (40 mg total) by mouth daily.  Dispense: 90 tablet; Refill: 1  Obesity (BMI 30.0-34.9) -     Hemoglobin A1c; Future -     Lipid panel; Future  Tingling -     TSH; Future -     Vitamin B12; Future -     VITAMIN D  25 Hydroxy (Vit-D Deficiency, Fractures); Future  Leg cramps -     CBC with Differential/Platelet; Future -     Comprehensive metabolic panel with GFR; Future -     TSH; Future -     Vitamin B12; Future -     VITAMIN D  25 Hydroxy (Vit-D Deficiency, Fractures); Future  Primary hypertension   - Blood pressure is a little elevated.  He will continue to work on lifestyle modifications.  Will continue to assess at future visits, not currently on medication. - For his cramps and tingling we will check vitamin B12, vitamin D , TSH as well  as electrolytes. - He declines colon cancer screening and age-appropriate vaccinations today. - Suspect his epigastric and left upper quadrant pain is related to GERD, will start PPI therapy, can consider referral to GI if no improvement in 6 to 8 weeks.  Time spent: 46 minutes reviewing chart, interviewing and examining patient and formulating plan of care.      Marguerita Shih, MD [facility]

## 2023-08-24 ENCOUNTER — Telehealth: Payer: Self-pay

## 2023-08-24 ENCOUNTER — Ambulatory Visit: Payer: Self-pay | Admitting: Internal Medicine

## 2023-08-24 DIAGNOSIS — E559 Vitamin D deficiency, unspecified: Secondary | ICD-10-CM | POA: Insufficient documentation

## 2023-08-24 DIAGNOSIS — E782 Mixed hyperlipidemia: Secondary | ICD-10-CM

## 2023-08-24 DIAGNOSIS — E785 Hyperlipidemia, unspecified: Secondary | ICD-10-CM | POA: Insufficient documentation

## 2023-08-24 MED ORDER — VITAMIN D (ERGOCALCIFEROL) 1.25 MG (50000 UNIT) PO CAPS: 50000.0000 [IU] | ORAL_CAPSULE | ORAL | 0 refills | Status: AC

## 2023-08-24 NOTE — Telephone Encounter (Signed)
 Copied from CRM [id number]. Topic: Clinical - Lab/Test Results >> Aug 24, 2023 10:42 AM Juluis Ok wrote: Reason for CRM: Patient returning missed phone call regarding lab results. Relayed results per providers note, verbatim. Patient verbalized understanding. Patient declined to schedule lab appointment. He states that he was informed via his insurance plan that Dr. Ival Marines is out of his network. States he is looking for a new PCP.

## 2023-08-24 NOTE — Telephone Encounter (Signed)
 Noted
